# Patient Record
Sex: Female | Born: 1940 | Race: White | Hispanic: No
Health system: Southern US, Community
[De-identification: ages and names within clinical notes are randomized; demographics above are authoritative.]

---

## 2001-12-20 ENCOUNTER — Encounter: Payer: Self-pay | Admitting: Urology

## 2001-12-21 ENCOUNTER — Encounter: Payer: Self-pay | Admitting: Urology

## 2001-12-21 ENCOUNTER — Ambulatory Visit (HOSPITAL_COMMUNITY): Admission: RE | Admit: 2001-12-21 | Discharge: 2001-12-21 | Payer: Self-pay | Admitting: Urology

## 2002-01-18 ENCOUNTER — Ambulatory Visit (HOSPITAL_COMMUNITY): Admission: RE | Admit: 2002-01-18 | Discharge: 2002-01-18 | Payer: Self-pay | Admitting: Urology

## 2005-07-30 ENCOUNTER — Encounter: Admission: RE | Admit: 2005-07-30 | Discharge: 2005-07-30 | Payer: Self-pay | Admitting: General Surgery

## 2005-08-16 ENCOUNTER — Encounter: Admission: RE | Admit: 2005-08-16 | Discharge: 2005-08-16 | Payer: Self-pay | Admitting: General Surgery

## 2005-08-18 ENCOUNTER — Ambulatory Visit (HOSPITAL_COMMUNITY): Admission: RE | Admit: 2005-08-18 | Discharge: 2005-08-19 | Payer: Self-pay | Admitting: General Surgery

## 2005-08-24 ENCOUNTER — Ambulatory Visit: Payer: Self-pay | Admitting: Oncology

## 2005-09-28 ENCOUNTER — Ambulatory Visit: Admission: RE | Admit: 2005-09-28 | Discharge: 2005-12-27 | Payer: Self-pay | Admitting: Radiation Oncology

## 2006-06-20 ENCOUNTER — Ambulatory Visit (HOSPITAL_COMMUNITY): Admission: RE | Admit: 2006-06-20 | Discharge: 2006-06-20 | Payer: Self-pay | Admitting: Urology

## 2007-01-23 ENCOUNTER — Ambulatory Visit (HOSPITAL_COMMUNITY): Admission: RE | Admit: 2007-01-23 | Discharge: 2007-01-23 | Payer: Self-pay | Admitting: Urology

## 2007-02-22 ENCOUNTER — Inpatient Hospital Stay (HOSPITAL_COMMUNITY): Admission: RE | Admit: 2007-02-22 | Discharge: 2007-02-25 | Payer: Self-pay | Admitting: Urology

## 2008-11-11 ENCOUNTER — Encounter: Payer: Self-pay | Admitting: Gastroenterology

## 2009-08-05 ENCOUNTER — Encounter: Payer: Self-pay | Admitting: Gastroenterology

## 2009-08-18 ENCOUNTER — Encounter: Payer: Self-pay | Admitting: Gastroenterology

## 2009-09-09 ENCOUNTER — Encounter: Payer: Self-pay | Admitting: Gastroenterology

## 2009-09-15 ENCOUNTER — Ambulatory Visit: Payer: Self-pay | Admitting: Gastroenterology

## 2009-09-16 ENCOUNTER — Ambulatory Visit: Payer: Self-pay | Admitting: Gastroenterology

## 2009-09-16 ENCOUNTER — Encounter: Payer: Self-pay | Admitting: Gastroenterology

## 2009-09-17 ENCOUNTER — Encounter: Payer: Self-pay | Admitting: Gastroenterology

## 2009-09-23 ENCOUNTER — Ambulatory Visit: Payer: Self-pay | Admitting: Gastroenterology

## 2009-09-24 LAB — CONVERTED CEMR LAB
Folate: 7.5 ng/mL
Vitamin B-12: 319 pg/mL (ref 211–911)

## 2009-09-29 ENCOUNTER — Telehealth: Payer: Self-pay | Admitting: Gastroenterology

## 2009-10-21 ENCOUNTER — Encounter (INDEPENDENT_AMBULATORY_CARE_PROVIDER_SITE_OTHER): Payer: Self-pay

## 2009-10-27 ENCOUNTER — Encounter (INDEPENDENT_AMBULATORY_CARE_PROVIDER_SITE_OTHER): Payer: Self-pay | Admitting: *Deleted

## 2009-10-27 ENCOUNTER — Ambulatory Visit: Payer: Self-pay | Admitting: Gastroenterology

## 2009-10-28 ENCOUNTER — Inpatient Hospital Stay (HOSPITAL_COMMUNITY): Admission: EM | Admit: 2009-10-28 | Discharge: 2009-11-02 | Payer: Self-pay | Admitting: Emergency Medicine

## 2009-10-28 ENCOUNTER — Ambulatory Visit: Payer: Self-pay | Admitting: Cardiovascular Disease

## 2009-10-29 ENCOUNTER — Encounter: Payer: Self-pay | Admitting: Cardiovascular Disease

## 2009-10-29 ENCOUNTER — Ambulatory Visit: Payer: Self-pay | Admitting: Oncology

## 2009-10-30 ENCOUNTER — Encounter (INDEPENDENT_AMBULATORY_CARE_PROVIDER_SITE_OTHER): Payer: Self-pay | Admitting: Internal Medicine

## 2009-10-30 ENCOUNTER — Ambulatory Visit: Payer: Self-pay | Admitting: Gastroenterology

## 2009-10-30 ENCOUNTER — Ambulatory Visit: Payer: Self-pay | Admitting: Surgery

## 2009-10-30 ENCOUNTER — Ambulatory Visit: Payer: Self-pay | Admitting: Oncology

## 2009-10-31 ENCOUNTER — Encounter (INDEPENDENT_AMBULATORY_CARE_PROVIDER_SITE_OTHER): Payer: Self-pay | Admitting: Internal Medicine

## 2010-09-19 IMAGING — CT CT BIOPSY
2 of 5 series · 9 of 27 positions shown, 11 images · non-contrast
Comparison: none

CLINICAL DATA: Severe unexplained anemia.  The patient requires
bone marrow biopsy.

[Series 4: pelvis st · axial · 0.98mm/px · z∈[-288,-198]mm · 8 of 23 slices shown]
[im 3/23  soft-tissue]
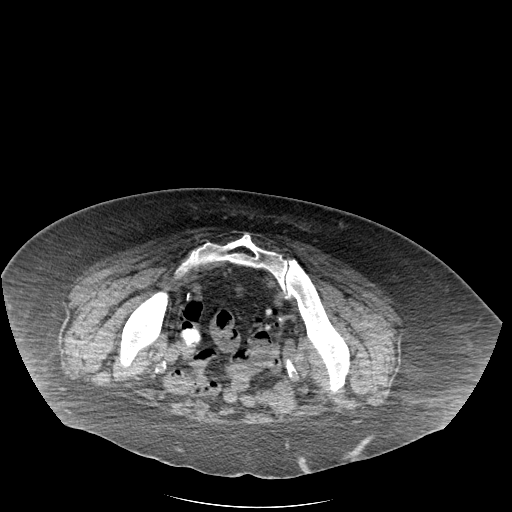
[im 5/23  soft-tissue]
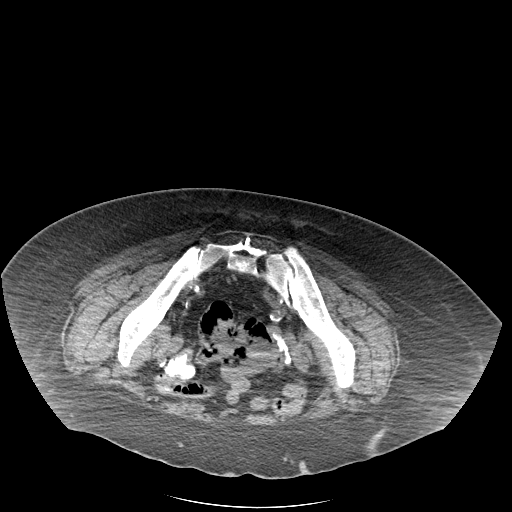
[im 8/23  soft-tissue]
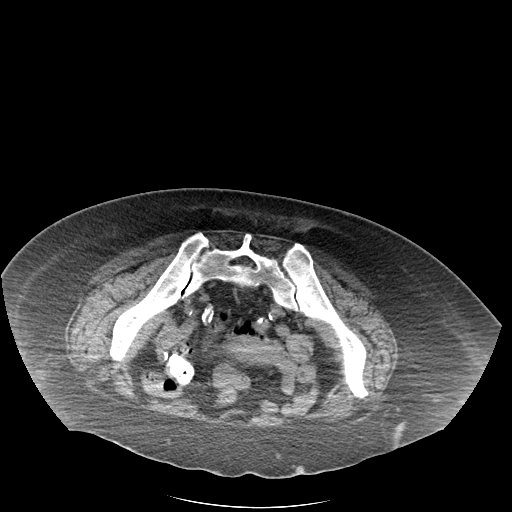
[im 11/23  soft-tissue]
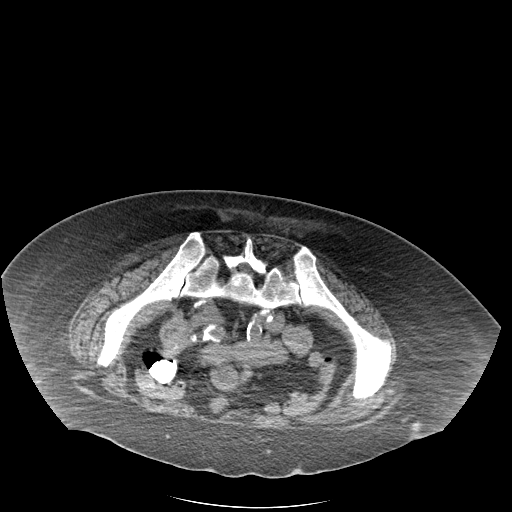
[im 13/23  soft-tissue]
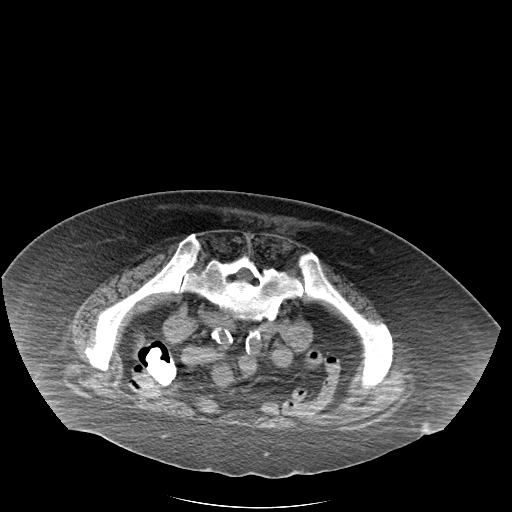
[im 16/23  soft-tissue]
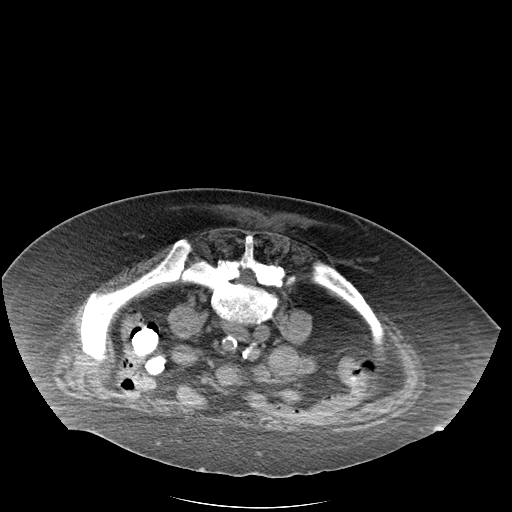
[im 19/23  soft-tissue]
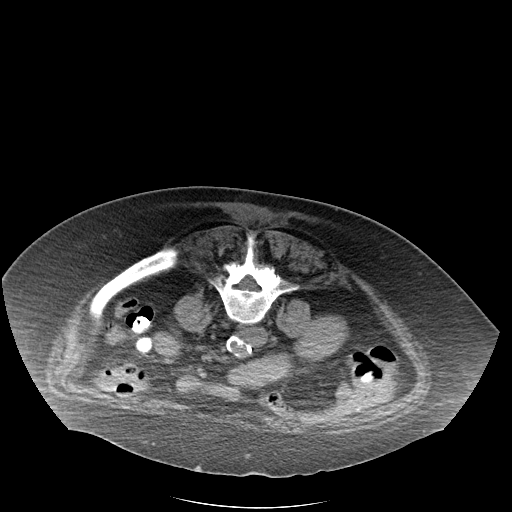
[im 21/23  soft-tissue]
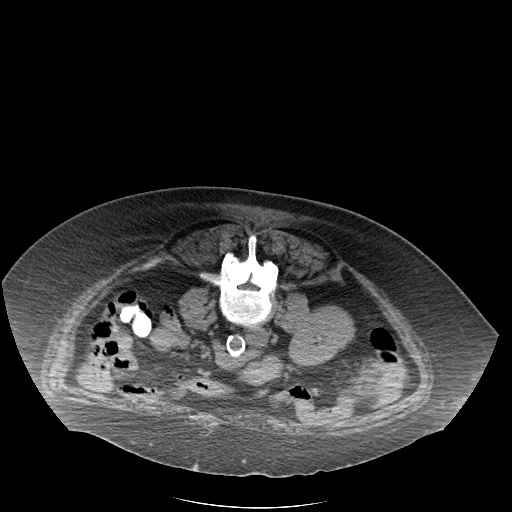

[Series 5: add scan 5.0 b70s · axial · 0.98mm/px · z∈[-248,-248]mm · 1 of 1 slices shown, 3 images]
[im 1/1  soft-tissue]
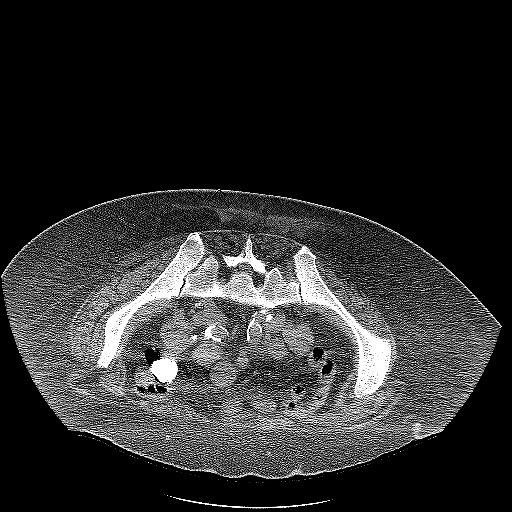
[im 1/1  lung]
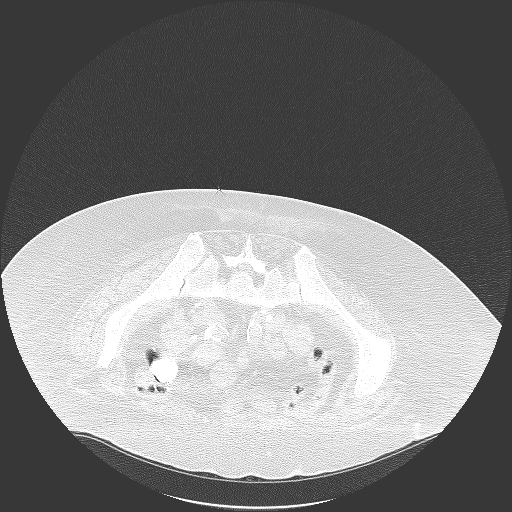
[im 1/1  bone]
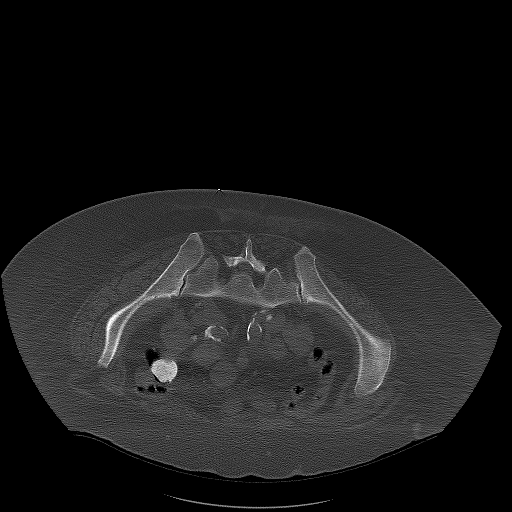

[9 of 27 positions shown; findings below may reference images not displayed]

CT GUIDED ASPIRATE AND CORE BIOPSY OF LEFT ILIAC BONE MARROW

Sedation: Versed 2.0 mg IV, Fentanyl 150 mcg IV

Total Moderate Sedation Time: 15 minutes.

Procedure:  The procedure risks, benefits, and alternatives were
explained to the patient.  Questions regarding the procedure were
encouraged and answered.  The patient understands and consents to
the procedure.

The left gluteal region was prepped with Betadine.  Sterile gown
and sterile gloves were used for the procedure.  Local anesthesia
was provided with 1% Lidocaine.

Under CT guidance, an 11 gauge bone cutting needle was advanced
from a posterior approach into the the left iliac bone.  Needle
positioning was confirmed with CT.  Initial non heparinized and
heparinized aspirate samples were obtained of bone marrow.
Multiple aspirates were performed at different levels of depth in
the iliac bone.

Core biopsy was performed with coaxial placement of a 14 gauge core
biopsy needle.  Multiple core samples of the marrow were obtained.

Complications: None
FINDINGS: Inspection of initial aspirates showed very few visible
particles.  Intact core biopsy samples were able to be obtained.
IMPRESSION: CT guided bone marrow biopsy of left posterior iliac bone with both
aspirate and core samples obtained.

## 2011-02-28 DEATH — deceased

## 2011-03-02 LAB — COMPREHENSIVE METABOLIC PANEL
ALT: 13 U/L (ref 0–35)
AST: 18 U/L (ref 0–37)
Albumin: 3.2 g/dL — ABNORMAL LOW (ref 3.5–5.2)
Alkaline Phosphatase: 96 U/L (ref 39–117)
BUN: 15 mg/dL (ref 6–23)
CO2: 25 mEq/L (ref 19–32)
Calcium: 8.6 mg/dL (ref 8.4–10.5)
GFR calc non Af Amer: >60 mL/min (ref >60)
Sodium: 137 mEq/L (ref 135–145)

## 2011-03-02 LAB — GLUCOSE, CAPILLARY
Glucose-Capillary: 109 mg/dL — ABNORMAL HIGH (ref 70–99)
Glucose-Capillary: 117 mg/dL — ABNORMAL HIGH (ref 70–99)
Glucose-Capillary: 121 mg/dL — ABNORMAL HIGH (ref 70–99)
Glucose-Capillary: 129 mg/dL — ABNORMAL HIGH (ref 70–99)
Glucose-Capillary: 132 mg/dL — ABNORMAL HIGH (ref 70–99)
Glucose-Capillary: 139 mg/dL — ABNORMAL HIGH (ref 70–99)
Glucose-Capillary: 141 mg/dL — ABNORMAL HIGH (ref 70–99)
Glucose-Capillary: 142 mg/dL — ABNORMAL HIGH (ref 70–99)
Glucose-Capillary: 145 mg/dL — ABNORMAL HIGH (ref 70–99)
Glucose-Capillary: 151 mg/dL — ABNORMAL HIGH (ref 70–99)
Glucose-Capillary: 161 mg/dL — ABNORMAL HIGH (ref 70–99)

## 2011-03-02 LAB — BASIC METABOLIC PANEL
BUN: 14 mg/dL (ref 6–23)
BUN: 16 mg/dL (ref 6–23)
Chloride: 97 mEq/L (ref 96–112)
Creatinine, Ser: 0.6 mg/dL (ref 0.4–1.2)
GFR calc non Af Amer: >60 mL/min (ref >60)
GFR calc non Af Amer: >60 mL/min (ref >60)
Glucose, Bld: 182 mg/dL — ABNORMAL HIGH (ref 70–99)
Potassium: 4 mEq/L (ref 3.5–5.1)
Sodium: 131 mEq/L — ABNORMAL LOW (ref 135–145)

## 2011-03-02 LAB — TSH: TSH: 1.531 u[IU]/mL (ref 0.350–4.500)

## 2011-03-02 LAB — MAGNESIUM: Magnesium: 2 mg/dL (ref 1.5–2.5)

## 2011-03-02 LAB — IRON AND TIBC
Iron: 54 ug/dL (ref 42–135)
Saturation Ratios: 18 % — ABNORMAL LOW (ref 20–55)
UIBC: 238 ug/dL

## 2011-03-02 LAB — CBC
HCT: 24.6 % — ABNORMAL LOW (ref 36.0–46.0)
HCT: 25.5 % — ABNORMAL LOW (ref 36.0–46.0)
Hemoglobin: 8.2 g/dL — ABNORMAL LOW (ref 12.0–15.0)
MCV: 86.4 fL (ref 78.0–100.0)
MCV: 86.7 fL (ref 78.0–100.0)
MCV: 86.8 fL (ref 78.0–100.0)
Platelets: 158 10*3/uL (ref 150–400)
Platelets: 161 10*3/uL (ref 150–400)
Platelets: 172 10*3/uL (ref 150–400)
Platelets: 172 10*3/uL (ref 150–400)
Platelets: 179 10*3/uL (ref 150–400)
RBC: 2.47 MIL/uL — ABNORMAL LOW (ref 3.87–5.11)
RDW: 21.5 % — ABNORMAL HIGH (ref 11.5–15.5)
RDW: 22.2 % — ABNORMAL HIGH (ref 11.5–15.5)
RDW: 23.7 % — ABNORMAL HIGH (ref 11.5–15.5)
RDW: 25.4 % — ABNORMAL HIGH (ref 11.5–15.5)
WBC: 2.6 10*3/uL — ABNORMAL LOW (ref 4.0–10.5)
WBC: 2.7 10*3/uL — ABNORMAL LOW (ref 4.0–10.5)
WBC: 3.1 10*3/uL — ABNORMAL LOW (ref 4.0–10.5)

## 2011-03-02 LAB — LACTATE DEHYDROGENASE: LDH: 235 U/L (ref 94–250)

## 2011-03-02 LAB — RETICULOCYTES: Retic Count, Absolute: 16 10*3/uL — ABNORMAL LOW (ref 19.0–186.0)

## 2011-03-02 LAB — DIFFERENTIAL
Basophils Relative: 0 % (ref 0–1)
Eosinophils Relative: 0 % (ref 0–5)
Lymphocytes Relative: 35 % (ref 12–46)
Lymphs Abs: 0.9 10*3/uL (ref 0.7–4.0)
Monocytes Relative: 9 % (ref 3–12)
Neutro Abs: 1.6 10*3/uL — ABNORMAL LOW (ref 1.7–7.7)
Neutrophils Relative %: 56 % (ref 43–77)

## 2011-03-02 LAB — PROTEIN ELECTROPH W RFLX QUANT IMMUNOGLOBULINS
Alpha-2-Globulin: 13.2 % — ABNORMAL HIGH (ref 7.1–11.8)
Gamma Globulin: 15 % (ref 11.1–18.8)
M-Spike, %: NOT DETECTED g/dL
Total Protein ELP: 8.2 g/dL (ref 6.0–8.3)

## 2011-03-02 LAB — FERRITIN: Ferritin: 222 ng/mL (ref 10–291)

## 2011-03-02 LAB — CARDIAC PANEL(CRET KIN+CKTOT+MB+TROPI)
CK, MB: 0.8 ng/mL (ref 0.3–4.0)
Total CK: 32 U/L (ref 7–177)
Troponin I: 0.14 ng/mL — ABNORMAL HIGH (ref 0.00–0.06)

## 2011-03-02 LAB — TROPONIN I: Troponin I: 0.11 ng/mL — ABNORMAL HIGH (ref 0.00–0.06)

## 2011-03-02 LAB — BILIRUBIN, FRACTIONATED(TOT/DIR/INDIR)
Bilirubin, Direct: 0.2 mg/dL (ref 0.0–0.3)
Indirect Bilirubin: 0.7 mg/dL (ref 0.3–0.9)
Total Bilirubin: 0.9 mg/dL (ref 0.3–1.2)

## 2011-03-03 LAB — BASIC METABOLIC PANEL
BUN: 19 mg/dL (ref 6–23)
Calcium: 8.5 mg/dL (ref 8.4–10.5)
Creatinine, Ser: 0.77 mg/dL (ref 0.4–1.2)
GFR calc non Af Amer: >60 mL/min (ref >60)
Glucose, Bld: 182 mg/dL — ABNORMAL HIGH (ref 70–99)
Potassium: 4.4 mEq/L (ref 3.5–5.1)

## 2011-03-03 LAB — URINE CULTURE
Colony Count: NO GROWTH
Culture: NO GROWTH

## 2011-03-03 LAB — GLUCOSE, CAPILLARY: Glucose-Capillary: 134 mg/dL — ABNORMAL HIGH (ref 70–99)

## 2011-03-03 LAB — CROSSMATCH: ABO/RH(D): O POS

## 2011-03-03 LAB — DIFFERENTIAL
Basophils Relative: 0 % (ref 0–1)
Eosinophils Absolute: 0 10*3/uL (ref 0.0–0.7)
Monocytes Absolute: 0.5 10*3/uL (ref 0.1–1.0)
Neutrophils Relative %: 72 % (ref 43–77)

## 2011-03-03 LAB — CBC
HCT: 16.6 % — ABNORMAL LOW (ref 36.0–46.0)
Platelets: 217 10*3/uL (ref 150–400)
RDW: 28.7 % — ABNORMAL HIGH (ref 11.5–15.5)

## 2011-03-03 LAB — POCT CARDIAC MARKERS
CKMB, poc: 1.1 ng/mL (ref 1.0–8.0)
CKMB, poc: <1 ng/mL — ABNORMAL LOW (ref 1.0–8.0)
Myoglobin, poc: 67.8 ng/mL (ref 12–200)
Troponin i, poc: 0.09 ng/mL (ref 0.00–0.09)

## 2011-03-03 LAB — CARDIAC PANEL(CRET KIN+CKTOT+MB+TROPI): CK, MB: 0.8 ng/mL (ref 0.3–4.0)

## 2011-03-03 LAB — HEMOGLOBIN A1C: Hgb A1c MFr Bld: 6.4 % — ABNORMAL HIGH (ref 4.6–6.1)

## 2011-03-03 LAB — URINALYSIS, ROUTINE W REFLEX MICROSCOPIC
Glucose, UA: NEGATIVE mg/dL
Hgb urine dipstick: NEGATIVE
pH: 5.5 (ref 5.0–8.0)

## 2015-06-16 ENCOUNTER — Encounter: Payer: Self-pay | Admitting: Gastroenterology

## 2016-12-01 NOTE — Progress Notes (Signed)
This encounter was created in error - please disregard.
